# Patient Record
Sex: Male | Born: 1985 | Race: White | Hispanic: No | Smoking: Never smoker
Health system: Southern US, Community
[De-identification: ages and names within clinical notes are randomized; demographics above are authoritative.]

## PROBLEM LIST (undated history)

## (undated) DIAGNOSIS — K219 Gastro-esophageal reflux disease without esophagitis: Secondary | ICD-10-CM

## (undated) DIAGNOSIS — G4730 Sleep apnea, unspecified: Secondary | ICD-10-CM

## (undated) DIAGNOSIS — F419 Anxiety disorder, unspecified: Secondary | ICD-10-CM

## (undated) DIAGNOSIS — M545 Low back pain, unspecified: Secondary | ICD-10-CM

## (undated) DIAGNOSIS — Z7729 Contact with and (suspected ) exposure to other hazardous substances: Secondary | ICD-10-CM

## (undated) HISTORY — DX: Gastro-esophageal reflux disease without esophagitis: K21.9

## (undated) HISTORY — DX: Sleep apnea, unspecified: G47.30

## (undated) HISTORY — DX: Contact with and (suspected) exposure to other hazardous substances: Z77.29

## (undated) HISTORY — DX: Low back pain, unspecified: M54.50

## (undated) HISTORY — DX: Anxiety disorder, unspecified: F41.9

## (undated) HISTORY — PX: HERNIA REPAIR: SHX51

---

## 2008-08-18 ENCOUNTER — Ambulatory Visit: Payer: Self-pay | Admitting: Internal Medicine

## 2009-11-18 ENCOUNTER — Emergency Department: Payer: Self-pay | Admitting: Emergency Medicine

## 2010-12-29 ENCOUNTER — Emergency Department: Payer: Self-pay | Admitting: Emergency Medicine
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
## 2021-12-02 ENCOUNTER — Ambulatory Visit: Payer: Self-pay | Admitting: Dietician

## 2022-01-15 ENCOUNTER — Encounter: Payer: Self-pay | Attending: Student | Admitting: Dietician

## 2022-01-15 ENCOUNTER — Encounter: Payer: Self-pay | Admitting: Dietician

## 2022-01-15 VITALS — Ht 70.0 in | Wt 225.5 lb

## 2022-01-15 DIAGNOSIS — Z6832 Body mass index (BMI) 32.0-32.9, adult: Secondary | ICD-10-CM | POA: Insufficient documentation

## 2022-01-15 DIAGNOSIS — Z713 Dietary counseling and surveillance: Secondary | ICD-10-CM | POA: Insufficient documentation

## 2022-01-15 DIAGNOSIS — R030 Elevated blood-pressure reading, without diagnosis of hypertension: Secondary | ICD-10-CM | POA: Insufficient documentation

## 2022-01-15 DIAGNOSIS — R7303 Prediabetes: Secondary | ICD-10-CM | POA: Insufficient documentation

## 2022-01-15 DIAGNOSIS — E669 Obesity, unspecified: Secondary | ICD-10-CM | POA: Insufficient documentation

## 2022-01-15 DIAGNOSIS — E782 Mixed hyperlipidemia: Secondary | ICD-10-CM | POA: Insufficient documentation

## 2022-01-15 NOTE — Progress Notes (Signed)
Medical Nutrition Therapy: Visit start time: 0825  end time: 0925  Assessment:   Referral Diagnosis: prediabetes, hyperlipidemia, elevated BP Other medical history/ diagnoses: GERD Psychosocial issues/ stress concerns: reports high stress level, manages with exercise  Medications, supplements: reconciled list in medical record   Preferred learning method:  Hands-on    Current weight: 225.5lbs with shoes, keys Height: 5'10" BMI: 32.26 Patient's personal weight goal: no specific goal; working to improve body composition  Progress and evaluation:  Patient reports having had symptoms of headache, nausea when drinking regular soda; feels low energy/ headache when delaying meals. Symptoms have improved since making diet changes He reports 2 episodes of feeling light-headed/ dizzy but none since changing diet. He had GERD symptoms including  reflux, pain, burning, currently well controlled with omeprazole  Food allergies: none Special diet practices: none Patient seeks help with ensuring healthy diet practices to reduce health risk Next PCP appt is 09/2022   Dietary Intake:  Usual eating pattern includes 3 meals and 0-1 snacks per day. Dining out frequency: 3-4 meals per week. Who plans meals/ buys groceries? Self, spouse Who prepares meals? Self, spouse  Breakfast: egg and cheese on toast; Clif/ granola bar and fruit ie banana Snack: rarely Lunch: brings from home -- leftovers + fruit; chicken sandwich or salad with chicken Snack: rarely chex mix or fruit Supper: chicken/ steak/ pork chops + veg ie brocc/ green beans/ salad with raw or grilled romaine  lettuce/ chicken sausage and rice/ tacos with 93% lean meat Snack: none Beverages: water, occ coffee in am with sugar free creamer; Bai coconut water; diet coke  Physical activity: running and weights 60 minutes, 3-4 times per week   Intervention:   Nutrition Care Education:   Basic nutrition: basic food groups; appropriate  nutrient balance; appropriate meal and snack schedule; general nutrition guidelines    Weight control: low sugar and low fat choices; portion control; estimated energy needs for healthy weight at 2000-2500 kcal, provided guidance for 2200kcal with 45% CHO, 25% pro, and 30% fat prediabetes: appropriate meal and snack schedule; appropriate carb intake and balance, healthy carb choices; role of fiber, protein, fat; physical activity; effects of stress Blood pressure: limiting high sodium foods; identifying food sources of potassium, magnesium Hyperlipidemia:  target goals for lipids; healthy and unhealthy fats; role of fiber, plant sterols; role of exercise   Other intervention notes: Patient has made multiple diet and lifestyle changes to improve health risk. He is motivated to continue, has support from spouse. No additional goals for change at this time, other than maintaining low-moderate carb intake and continuing with current healthy eating pattern and regular exercise. No follow up scheduled at this time; patient to schedule later as needed.   Nutritional Diagnosis:  [place]-2.1 Inpaired nutrition utilization and [place]-2.2 Altered nutrition-related laboratory As related to prediabetes, hyperlipidemia, and risk for hypertension.  As evidenced by elevated BP, high total cholesterol and LDL, and elevated HbA1C. [place]-3.3 Overweight/obesity As related to history of restricted activity due to back issues, excess calories.  As evidenced by patient with current BMI of 32.36, working on diet and lifestyle changes to improve body composition and reduce health risk.   Education Materials given:  General diet guidelines for Diabetes (prevention) Plate Planner with food lists, sample meal pattern Sample menus Visit summary with goals/ instructions   Learner/ who was taught:  Patient    Level of understanding: Verbalizes/ demonstrates competency   Demonstrated degree of understanding via:   Teach  back Learning barriers:  None  Willingness to learn/ readiness for change: Eager, change in progress   Monitoring and Evaluation:  Dietary intake, exercise, BG control, blood lipids, BP, and body weight      follow up: prn

## 2022-01-15 NOTE — Patient Instructions (Signed)
Great job making healthy changes, keep it up! Continue to control amounts of carb with meals; goal is about 60 grams for each meal. Keep working to choose some whole grain and fiber-rich options. Keep up the regular exercise for ongoing healthy metabolism, and lower BP, cholesterol, and blood sugar.

## 2023-04-16 ENCOUNTER — Encounter: Payer: Self-pay | Admitting: *Deleted

## 2023-04-20 ENCOUNTER — Institutional Professional Consult (permissible substitution): Payer: No Typology Code available for payment source | Admitting: Neurology

## 2023-04-20 ENCOUNTER — Telehealth: Payer: Self-pay | Admitting: Neurology

## 2023-04-20 ENCOUNTER — Encounter: Payer: Self-pay | Admitting: Neurology

## 2023-04-20 NOTE — Telephone Encounter (Signed)
Pt said have had sleep study already done with [facility]. Sleep Lab was ordered by [place].

## 2023-12-09 ENCOUNTER — Emergency Department
Admission: EM | Admit: 2023-12-09 | Discharge: 2023-12-09 | Disposition: A | Discharge status: Home / Self Care | Attending: Emergency Medicine | Admitting: Emergency Medicine

## 2023-12-09 ENCOUNTER — Other Ambulatory Visit: Payer: Self-pay

## 2023-12-09 DIAGNOSIS — I10 Essential (primary) hypertension: Secondary | ICD-10-CM | POA: Diagnosis present

## 2023-12-09 LAB — BASIC METABOLIC PANEL WITH GFR
Anion gap: 9 (ref 5–15)
BUN: 15 mg/dL (ref 6–20)
CO2: 28 mmol/L (ref 22–32)
Calcium: 9.4 mg/dL (ref 8.9–10.3)
Chloride: 103 mmol/L (ref 98–111)
Creatinine, Ser: 1.05 mg/dL (ref 0.61–1.24)
GFR, Estimated: >60 mL/min (ref >60)
Glucose, Bld: 99 mg/dL (ref 70–99)
Potassium: 3.8 mmol/L (ref 3.5–5.1)
Sodium: 140 mmol/L (ref 135–145)

## 2023-12-09 LAB — CBC
HCT: 47.8 % (ref 39.0–52.0)
Hemoglobin: 16.7 g/dL (ref 13.0–17.0)
MCH: 29 pg (ref 26.0–34.0)
MCHC: 34.9 g/dL (ref 30.0–36.0)
MCV: 83.1 fL (ref 80.0–100.0)
Platelets: 278 K/uL (ref 150–400)
RBC: 5.75 MIL/uL (ref 4.22–5.81)
RDW: 12.4 % (ref 11.5–15.5)
WBC: 8.1 K/uL (ref 4.0–10.5)
nRBC: 0 % (ref 0.0–0.2)

## 2023-12-09 MED ORDER — HYDROCHLOROTHIAZIDE 12.5 MG PO TABS: 12.5000 mg | ORAL_TABLET | Freq: Every day | ORAL | 5 refills | Status: AC

## 2023-12-09 NOTE — ED Provider Notes (Signed)
 [facility] Provider Note    Event Date/Time   First MD Initiated Contact with Patient 12/09/23 1700     (approximate)  History   Chief Complaint: Hypertension  HPI  Jose Vaughan is a 38 y.o. male with a past medical history anxiety, gastric reflux, presents to the emergency department for elevated blood pressure.  Patient states he has been checking his blood pressure as he has been feeling somewhat foggy and fatigued.  His blood pressures have been ranging for the most part around 140 systolic.  Patient states in the past he has been seen by his PCP and they have had to recheck his pressure multiple times in the office as sometimes it is elevated and sometimes it would come down.  They have overall opted not to start a medication as of yet.  Patient is concerned because of his increasing blood pressure currently 138/87.  No chest pain or shortness of breath.  Patient also states he has been experiencing increased thirst and urinating frequently, his daughter is a type I diabetic.  Physical Exam   Triage Vital Signs: ED Triage Vitals [12/09/23 1606]  Encounter Vitals Group     BP (!) 135/110     Girls Systolic BP Percentile      Girls Diastolic BP Percentile      Boys Systolic BP Percentile      Boys Diastolic BP Percentile      Pulse Rate 77     Resp 20     Temp 98.9 F (37.2 C)     Temp Source Oral     SpO2 100 %     Weight 230 lb (104.3 kg)     Height 5' 9 (1.753 m)     Head Circumference      Peak Flow      Pain Score 4     Pain Loc      Pain Education      Exclude from Growth Chart     Most recent vital signs: Vitals:   12/09/23 1635 12/09/23 1645  BP:  138/87  Pulse:    Resp:    Temp:    SpO2: 100%     General: Awake, no distress.  CV:  Good peripheral perfusion.  Regular rate and rhythm  Resp:  Normal effort.  Equal breath sounds bilaterally.  Abd:  No distention.    ED Results / Procedures / Treatments   EKG  EKG viewed  and interpreted by myself shows a normal sinus rhythm at 78 bpm with a narrow QRS, normal axis, normal intervals, no concerning ST changes.  MEDICATIONS ORDERED IN ED: Medications - No data to display   IMPRESSION / MDM / ASSESSMENT AND PLAN / ED COURSE  I reviewed the triage vital signs and the nursing notes.  Patient's presentation is most consistent with acute presentation with potential threat to life or bodily function.  Patient presents to the emergency department for high blood pressure fatigue and increased thirst.  Patient's daughter is a type I diabetic he is concerned that he may have developed diabetes.  His blood pressures have been running around 140 and states he is concerned that he may need to be on a medication.  Patient's lab work today is reassuring with a normal CBC and a normal chemistry including a normal blood glucose.  We will send the hemoglobin A1c as a precaution patient states he will follow-up with his PCP for the results.  No signs of  diabetes based on the blood work.  Patient is hypertensive 138/87.  Patient states that is where his blood pressure has been running at home.  I believe starting the patient on a low-dose medication like hydrochlorothiazide  12.5 mg daily could be beneficial to the patient.  I discussed with the patient to check his blood pressure once or twice a week first thing in the morning and if his blood pressure drops below 120 he should discontinue this medication until he is seen by his PCP.  Otherwise he will maintain a log of the blood pressure numbers and bring this to his PCP when he is seen.  Patient agreeable to plan.  FINAL CLINICAL IMPRESSION(S) / ED DIAGNOSES   Hypertension    Note:  This document was prepared using Dragon voice recognition software and may include unintentional dictation errors.   Dorothyann Drivers, MD 12/09/23 1718

## 2023-12-09 NOTE — Discharge Instructions (Addendum)
 Please take your medication each morning as we discussed.  Please check your blood pressure once or twice weekly first thing in the morning.  If this number drops below 120 on the top please discontinue this blood pressure medication until seen by your doctor.  A hemoglobin A1c (diabetes test) has been sent.  As we discussed please have your doctor follow-up with this result or you may look at your results on your MyChart, we are unfortunately unable to follow-up on these results.

## 2023-12-09 NOTE — ED Triage Notes (Signed)
 Pt presents with a 1 week hx of tension HA, increased thirst, blurred vision when bending over. Yesterday he noticed his BP was elevated with ambulatory readings of 145/96.

## 2023-12-10 LAB — HEMOGLOBIN A1C
Hgb A1c MFr Bld: 5.6 % (ref 4.8–5.6)
Mean Plasma Glucose: 114 mg/dL

## 2024-03-21 ENCOUNTER — Other Ambulatory Visit: Payer: Self-pay | Admitting: Student

## 2024-03-21 DIAGNOSIS — R1032 Left lower quadrant pain: Secondary | ICD-10-CM

## 2024-03-21 DIAGNOSIS — R102 Pelvic and perineal pain unspecified side: Secondary | ICD-10-CM

## 2024-03-22 ENCOUNTER — Ambulatory Visit
Admission: RE | Admit: 2024-03-22 | Discharge: 2024-03-22 | Disposition: A | Discharge status: Home / Self Care | Source: Ambulatory Visit | Attending: Student | Admitting: Student

## 2024-03-22 DIAGNOSIS — R102 Pelvic and perineal pain unspecified side: Secondary | ICD-10-CM | POA: Diagnosis present

## 2024-03-22 DIAGNOSIS — R1032 Left lower quadrant pain: Secondary | ICD-10-CM | POA: Insufficient documentation

## 2024-04-11 ENCOUNTER — Ambulatory Visit: Admitting: Urology

## 2024-04-11 ENCOUNTER — Encounter: Payer: Self-pay | Admitting: Urology

## 2024-04-11 VITALS — BP 127/84 | HR 83 | Ht 69.0 in | Wt 230.0 lb

## 2024-04-11 DIAGNOSIS — R102 Pelvic and perineal pain unspecified side: Secondary | ICD-10-CM

## 2024-04-11 DIAGNOSIS — R399 Unspecified symptoms and signs involving the genitourinary system: Secondary | ICD-10-CM

## 2024-04-11 LAB — BLADDER SCAN AMB NON-IMAGING: Scan Result: 117

## 2024-04-11 MED ORDER — TAMSULOSIN HCL 0.4 MG PO CAPS: 0.4000 mg | ORAL_CAPSULE | Freq: Every day | ORAL | 3 refills | Status: AC

## 2024-04-11 NOTE — Progress Notes (Unsigned)
 04/11/2024 1:48 PM   Jose Vaughan 10/06/1985 [id number]  Referring provider: Gretel Rosina CROME, PA-C [address] [place],  [place] [address]  Chief Complaint  Patient presents with   Other    HPI: June Vacha is a 38 y.o. male referred for evaluation of lower urinary tract symptoms.  ~10 year history of lower urinary tract symptoms including urinary frequency, urgency and a weak urinary stream IPSS today 31/35 with most bothersome symptoms of sensation of incomplete emptying, frequency, intermittent stream, urgency and a weak urinary stream Denies dysuria.  He had 1 episode of slightly red-tinged urine.  This was present on 1 void and UAs have always been normal He notes dull aching pain in the lower pelvis just above the penopubic junction but feels the pain is deeper inside.  He also notes suprapubic discomfort.  He does have lumbar disc disease and chronic low back pain He feels over the last 10 years his symptoms have progressively worsened.  He had a scrotal ultrasound 03/22/2024 which was unremarkable Fluid intake estimated at 64 ounces of water per day.  He has 1 cup of coffee in the morning and an occasional Diet Coke in the afternoon No prior treatment for his symptoms   PMH: Past Medical History:  Diagnosis Date   Anxiety    Contact with and (suspected) exposure to other hazardous substances    GERD (gastroesophageal reflux disease)    Low back pain    Sleep apnea     Surgical History: Past Surgical History:  Procedure Laterality Date   HERNIA REPAIR      Home Medications:  Allergies as of 04/11/2024   No Known Allergies      Medication List        Accurate as of April 11, 2024  1:48 PM. If you have any questions, ask your nurse or doctor.          ANTIHISTAMINE PO Take 1 each by mouth as needed.   famotidine 20 MG tablet Commonly known as: PEPCID Take 20 mg by mouth 2 (two) times daily as needed for heartburn or indigestion.    hydrochlorothiazide  12.5 MG tablet Commonly known as: HYDRODIURIL  Take 1 tablet (12.5 mg total) by mouth daily.   OMEPRAZOLE PO Take 20 mg by mouth daily.        Allergies: No Known Allergies  Family History: No family history on file.  Social History:  reports that he has never smoked. He has never used smokeless tobacco. He reports current alcohol use of about 4.0 - 5.0 standard drinks of alcohol per week. No history on file for drug use.   Physical Exam: BP 127/84   Pulse 83   Ht 5' 9 (1.753 m)   Wt 230 lb (104.3 kg)   BMI 33.97 kg/m   Constitutional:  Alert, No acute distress. HEENT: [place] AT Respiratory: Normal respiratory effort, no increased work of breathing. GU: Phallus circumcised without lesions.  Testes descended bilaterally masses or tenderness.  Spermatic cord/epididymis palpably normal bilaterally.  Prostate 30 g, smooth without nodules or induration.  No prostatic tenderness.  Moderate pelvic floor tenderness L >R Psychiatric: Normal mood and affect.  Laboratory Data:  Urinalysis Dipstick/microscopy negative  Pertinent Imaging: Scrotal ultrasound images were personally reviewed and interpreted   Assessment & Plan:   38 y.o. male with a symptom constellation of severe lower urinary tract symptoms and pelvic pain.  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

## 2024-04-12 LAB — URINALYSIS, COMPLETE
Bilirubin, UA: NEGATIVE
Glucose, UA: NEGATIVE
Ketones, UA: NEGATIVE
Leukocytes,UA: NEGATIVE
Nitrite, UA: NEGATIVE
Protein,UA: NEGATIVE
RBC, UA: NEGATIVE
Specific Gravity, UA: 1.02 (ref 1.005–1.030)
Urobilinogen, Ur: 0.2 mg/dL (ref 0.2–1.0)
pH, UA: 6 (ref 5.0–7.5)

## 2024-04-12 LAB — MICROSCOPIC EXAMINATION: Bacteria, UA: NONE SEEN

## 2024-05-18 ENCOUNTER — Encounter: Payer: Self-pay | Admitting: Urology

## 2024-05-18 ENCOUNTER — Ambulatory Visit: Admitting: Urology

## 2024-05-18 VITALS — BP 126/87 | HR 89 | Ht 69.5 in | Wt 230.0 lb

## 2024-05-18 DIAGNOSIS — R399 Unspecified symptoms and signs involving the genitourinary system: Secondary | ICD-10-CM | POA: Diagnosis not present

## 2024-05-18 LAB — URINALYSIS, COMPLETE
Bilirubin, UA: NEGATIVE
Glucose, UA: NEGATIVE
Ketones, UA: NEGATIVE
Leukocytes,UA: NEGATIVE
Nitrite, UA: NEGATIVE
Protein,UA: NEGATIVE
Specific Gravity, UA: 1.025 (ref 1.005–1.030)
Urobilinogen, Ur: 0.2 mg/dL (ref 0.2–1.0)
pH, UA: 6 (ref 5.0–7.5)

## 2024-05-18 LAB — MICROSCOPIC EXAMINATION: Bacteria, UA: NONE SEEN

## 2024-05-18 NOTE — Progress Notes (Signed)
" ° °  05/18/2024  CC:  Chief Complaint  Patient presents with   Cysto    HPI: Refer to my prior note 04/11/2024.  No improvement in voiding pattern on tamsulosin  though feels his pain improved.  UA today negative  Blood pressure 126/87, pulse 89, height 5' 9.5 (1.765 m), weight 230 lb (104.3 kg). NED. A&Ox3.   No respiratory distress   Abd soft, NT, ND Normal phallus with bilateral descended testicles  Cystoscopy Procedure Note  Patient identification was confirmed, informed consent was obtained, and patient was prepped using Betadine solution.  Lidocaine jelly was administered per urethral meatus.     Pre-Procedure: - Inspection reveals narrowing of the fossa navicularis with moderate difficulty in traversing with the cystoscope.  Procedure: The flexible cystoscope was introduced without difficulty - No urethral strictures/lesions are present. - Nonocclusive prostate  - Elevated bladder neck - Bilateral ureteral orifices identified - Bladder mucosa  reveals no ulcers, tumors, or lesions - No bladder stones - No trabeculation  Retroflexion shows no abnormalities   Post-Procedure: - Patient tolerated the procedure well  Assessment/ Plan: Mild stricture fossa navicularis Will reassess voiding after dilation of this area with the cystoscope Has lumbar disc disease and if no improvement in his voiding recommend further evaluation with urodynamics Okay to discontinue tamsulosin     Jose JAYSON Barba, MD "
